# Patient Record
Sex: Male | Born: 2000 | Race: White | Marital: Single | Smoking: Never smoker
Health system: Southern US, Community
[De-identification: ages and names within clinical notes are randomized; demographics above are authoritative.]

## PROBLEM LIST (undated history)

## (undated) DIAGNOSIS — Z87898 Personal history of other specified conditions: Secondary | ICD-10-CM

## (undated) DIAGNOSIS — F909 Attention-deficit hyperactivity disorder, unspecified type: Secondary | ICD-10-CM

## (undated) DIAGNOSIS — F88 Other disorders of psychological development: Secondary | ICD-10-CM

## (undated) HISTORY — PX: TONSILECTOMY, ADENOIDECTOMY, BILATERAL MYRINGOTOMY AND TUBES: SHX2538

---
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
## 2019-03-28 ENCOUNTER — Other Ambulatory Visit: Payer: Self-pay

## 2019-03-28 DIAGNOSIS — Z20822 Contact with and (suspected) exposure to covid-19: Secondary | ICD-10-CM

## 2019-03-30 LAB — NOVEL CORONAVIRUS, NAA: SARS-CoV-2, NAA: NOT DETECTED

## 2019-03-31 ENCOUNTER — Telehealth: Payer: Self-pay | Admitting: General Practice

## 2019-03-31 NOTE — Telephone Encounter (Signed)
Pt aware covid lab test negative, not detected °
